# Patient Record
Sex: Male | Born: 1965 | Race: Black or African American | Hispanic: No | Marital: Married | Smoking: Never smoker
Health system: Southern US, Community
[De-identification: ages and names within clinical notes are randomized; demographics above are authoritative.]

## PROBLEM LIST (undated history)

## (undated) DIAGNOSIS — I10 Essential (primary) hypertension: Secondary | ICD-10-CM

## (undated) HISTORY — PX: ANKLE SURGERY: SHX546

---

## 2018-01-21 ENCOUNTER — Other Ambulatory Visit: Payer: Self-pay

## 2018-01-21 ENCOUNTER — Emergency Department (HOSPITAL_BASED_OUTPATIENT_CLINIC_OR_DEPARTMENT_OTHER)
Admission: EM | Admit: 2018-01-21 | Discharge: 2018-01-21 | Disposition: A | Discharge status: Home / Self Care | Payer: BC Managed Care – PPO | Attending: Emergency Medicine | Admitting: Emergency Medicine

## 2018-01-21 ENCOUNTER — Encounter (HOSPITAL_BASED_OUTPATIENT_CLINIC_OR_DEPARTMENT_OTHER): Payer: Self-pay | Admitting: Emergency Medicine

## 2018-01-21 ENCOUNTER — Emergency Department (HOSPITAL_BASED_OUTPATIENT_CLINIC_OR_DEPARTMENT_OTHER): Payer: BC Managed Care – PPO

## 2018-01-21 DIAGNOSIS — R072 Precordial pain: Secondary | ICD-10-CM | POA: Diagnosis not present

## 2018-01-21 DIAGNOSIS — I10 Essential (primary) hypertension: Secondary | ICD-10-CM | POA: Diagnosis not present

## 2018-01-21 DIAGNOSIS — Z79899 Other long term (current) drug therapy: Secondary | ICD-10-CM | POA: Diagnosis not present

## 2018-01-21 DIAGNOSIS — R079 Chest pain, unspecified: Secondary | ICD-10-CM | POA: Diagnosis present

## 2018-01-21 HISTORY — DX: Essential (primary) hypertension: I10

## 2018-01-21 LAB — CBC
HEMATOCRIT: 45.6 % (ref 39.0–52.0)
Hemoglobin: 15.3 g/dL (ref 13.0–17.0)
MCH: 28.2 pg (ref 26.0–34.0)
MCHC: 33.6 g/dL (ref 30.0–36.0)
MCV: 84.1 fL (ref 78.0–100.0)
Platelets: 238 10*3/uL (ref 150–400)
RBC: 5.42 MIL/uL (ref 4.22–5.81)
RDW: 12.9 % (ref 11.5–15.5)
WBC: 8.5 10*3/uL (ref 4.0–10.5)

## 2018-01-21 LAB — BASIC METABOLIC PANEL
BUN: 15 mg/dL (ref 6–20)
CREATININE: 1.25 mg/dL — AB (ref 0.61–1.24)
GFR calc non Af Amer: >60 mL/min (ref >60)

## 2018-01-21 LAB — I-STAT CHEM 8, ED
BUN: 14 mg/dL (ref 6–20)
CALCIUM ION: 1.23 mmol/L (ref 1.15–1.40)
CHLORIDE: 100 mmol/L — AB (ref 101–111)
Creatinine, Ser: 1.2 mg/dL (ref 0.61–1.24)
GLUCOSE: 122 mg/dL — AB (ref 65–99)
HCT: 48 % (ref 39.0–52.0)
HEMOGLOBIN: 16.3 g/dL (ref 13.0–17.0)
Potassium: 4.1 mmol/L (ref 3.5–5.1)
SODIUM: 140 mmol/L (ref 135–145)
TCO2: 27 mmol/L (ref 22–32)

## 2018-01-21 LAB — TROPONIN I

## 2018-01-21 MED ORDER — ALUM & MAG HYDROXIDE-SIMETH 200-200-20 MG/5ML PO SUSP
30.0000 mL | Freq: Once | ORAL | Status: AC
  Administered 2018-01-21: 30 mL via ORAL
  Filled 2018-01-21: qty 30

## 2018-01-21 NOTE — ED Provider Notes (Signed)
[facility] EMERGENCY DEPARTMENT Provider Note   CSN: [id number] Arrival date & time: 01/21/18  1008     History   Chief Complaint Chief Complaint  Patient presents with  . Chest Pain    HPI Brad Nguyen is a 52 y.o. male.  HPI 52 year old male presents the emergency department with sharp chest pain which began this morning.  His left side without significant radiation.  He came to the ER for further evaluation.  He had similar pain around Thanksgiving as well as similar pain around Christmas time.  He has seen several providers including a cardiologist in the [place] area.  He underwent a stress test approximately 2 weeks ago which she has not received the results of but he states he has not been contacted either.  He is scheduled to see the cardiologist next week.  He is on metoprolol.  He has no other complaints at this time.  No fevers or chills.  Denies productive cough.  No family history of early cardiac disease.  He states no chest discomfort during his stress test.   Past Medical History:  Diagnosis Date  . Hypertension     There are no active problems to display for this patient.   Past Surgical History:  Procedure Laterality Date  . ANKLE SURGERY         Home Medications    Prior to Admission medications   Medication Sig Start Date End Date Taking? Authorizing Provider  metoprolol succinate (TOPROL-XL) 25 MG 24 hr tablet Take 25 mg by mouth daily.   Yes [provider]    Family History No family history on file.  Social History Social History   Tobacco Use  . Smoking status: Never Smoker  . Smokeless tobacco: Never Used  Substance Use Topics  . Alcohol use: Yes    Frequency: Never    Comment: occ  . Drug use: No     Allergies   Patient has no known allergies.   Review of Systems Review of Systems  All other systems reviewed and are negative.    Physical Exam Updated Vital Signs BP (!) 142/88   Pulse 71   Temp  98.3 F (36.8 C) (Oral)   Resp 17   Ht 5\' 11"  (1.803 m)   Wt 124.7 kg (275 lb)   SpO2 99%   BMI 38.35 kg/m   Physical Exam  Constitutional: He is oriented to person, place, and time.  HENT:  Head: Normocephalic and atraumatic.  Eyes: EOM are normal.  Neck: Normal range of motion.  Cardiovascular: Normal rate, regular rhythm and intact distal pulses.  Pulmonary/Chest: Effort normal and breath sounds normal. No respiratory distress.  Abdominal: He exhibits no distension. There is no tenderness.  Musculoskeletal: Normal range of motion.  Neurological: He is alert and oriented to person, place, and time.  Skin: Skin is warm and dry.  Nursing note and vitals reviewed.    ED Treatments / Results  Labs (all labs ordered are listed, but only abnormal results are displayed) Labs Reviewed  BASIC METABOLIC PANEL - Abnormal; Notable for the following components:      Result Value   Creatinine, Ser 1.25 (*)    All other components within normal limits  I-STAT CHEM 8, ED - Abnormal; Notable for the following components:   Chloride 100 (*)    Glucose, Bld 122 (*)    All other components within normal limits  CBC  TROPONIN I    EKG  EKG  Interpretation  Date/Time:  Monday January 21 2018 10:19:44 EST Ventricular Rate:  74 PR Interval:    QRS Duration: 89 QT Interval:  373 QTC Calculation: 414 R Axis:   36 Text Interpretation:  Sinus rhythm Borderline repolarization abnormality Baseline wander in lead(s) V1 anormal ST segment lateral leads and inferior lead depression No old tracing to compare Confirmed by Eber Hong ([id number]) on 01/23/2018 10:36:13 AM       Radiology Dg Chest 2 View  Result Date: 01/21/2018 CLINICAL DATA:  Shortness of breath and chest pain since beginning a new cardiac medication 3 weeks ago. Patient is an occasional smoker. There is history of hypertension. EXAM: CHEST  2 VIEW COMPARISON:  None in PACs FINDINGS: The lungs are adequately inflated. There is  no focal infiltrate. There is no pleural effusion. The heart and pulmonary vascularity are normal. The mediastinum is normal in width. There calcification in the wall of the aortic arch. The trachea is midline. The bony thorax is unremarkable. IMPRESSION: There is no active cardiopulmonary disease. Thoracic aortic atherosclerosis. Electronically Signed   By: David  [place] M.D.   On: 01/21/2018 12:04    Procedures Procedures (including critical care time)  Medications Ordered in ED Medications  alum & mag hydroxide-simeth (MAALOX/MYLANTA) 200-200-20 MG/5ML suspension 30 mL (30 mLs Oral Given 01/21/18 1123)     Initial Impression / Assessment and Plan / ED Course  I have reviewed the triage vital signs and the nursing notes.  Pertinent labs & imaging results that were available during my care of the patient were reviewed by me and considered in my medical decision making (see chart for details).    Overall the patient is well-appearing.  His EKG is without ischemic changes.  He has had resolution of his chest discomfort.  Troponin negative.  Patient will need outpatient cardiology and primary care follow-up.  He has been seen at several emergency departments with chest pain.  He has had chest pain rule out in the hospital before and reassuring echocardiogram within the past 2 years.  Patient understands to return the emergency department for new or worsening symptoms.  Doubt dissection.  Doubt PE.  Doubt ACS.  Final Clinical Impressions(s) / ED Diagnoses   Final diagnoses:  Precordial chest pain    ED Discharge Orders    None       Azalia Bilis, MD 01/24/18 [phone]

## 2018-01-21 NOTE — ED Triage Notes (Signed)
Patient has had intermittent chest pain since this past thanksgiving. Has had multiple work up. Patient states that he has had 2d echos and is awaiting a cardiology appointment in [place].

## 2018-01-21 NOTE — Discharge Instructions (Signed)
Please follow up with your cardiologist as scheduled or sooner as needed

## 2018-10-25 ENCOUNTER — Ambulatory Visit
Admission: EM | Admit: 2018-10-25 | Discharge: 2018-10-25 | Disposition: A | Discharge status: Home / Self Care | Payer: BC Managed Care – PPO | Attending: Emergency Medicine | Admitting: Emergency Medicine

## 2018-10-25 ENCOUNTER — Ambulatory Visit (INDEPENDENT_AMBULATORY_CARE_PROVIDER_SITE_OTHER): Payer: BC Managed Care – PPO

## 2018-10-25 ENCOUNTER — Other Ambulatory Visit: Payer: Self-pay

## 2018-10-25 DIAGNOSIS — K219 Gastro-esophageal reflux disease without esophagitis: Secondary | ICD-10-CM | POA: Insufficient documentation

## 2018-10-25 DIAGNOSIS — I10 Essential (primary) hypertension: Secondary | ICD-10-CM | POA: Diagnosis not present

## 2018-10-25 DIAGNOSIS — R072 Precordial pain: Secondary | ICD-10-CM

## 2018-10-25 DIAGNOSIS — Z79899 Other long term (current) drug therapy: Secondary | ICD-10-CM | POA: Diagnosis not present

## 2018-10-25 DIAGNOSIS — Z8249 Family history of ischemic heart disease and other diseases of the circulatory system: Secondary | ICD-10-CM | POA: Diagnosis not present

## 2018-10-25 LAB — TROPONIN I

## 2018-10-25 MED ORDER — ALUM & MAG HYDROXIDE-SIMETH 200-200-20 MG/5ML PO SUSP: 30.0000 mL | Freq: Once | ORAL | Status: DC

## 2018-10-25 MED ORDER — PANTOPRAZOLE SODIUM 20 MG PO TBEC: 20.0000 mg | DELAYED_RELEASE_TABLET | Freq: Every day | ORAL | 0 refills | Status: AC

## 2018-10-25 MED ORDER — KETOROLAC TROMETHAMINE 60 MG/2ML IM SOLN
30.0000 mg | Freq: Once | INTRAMUSCULAR | Status: AC
  Administered 2018-10-25: 30 mg via INTRAMUSCULAR

## 2018-10-25 MED ORDER — LIDOCAINE VISCOUS HCL 2 % MT SOLN: 15.0000 mL | Freq: Once | OROMUCOSAL | Status: DC

## 2018-10-25 MED ORDER — NAPROXEN 500 MG PO TABS: 500.0000 mg | ORAL_TABLET | Freq: Two times a day (BID) | ORAL | 0 refills | Status: AC

## 2018-10-25 NOTE — Discharge Instructions (Addendum)
Troponin was negative.  Your EKG showed no signs of ischemia and you had a normal chest x-ray.  I suspect that this could be musculoskeletal, such as a costochondritis.  The Naprosyn will help with any inflammation.  Her GERD could also be contributing to this, so start the Protonix.  You may increase it to 2 tabs once a day. Here is a list of primary care providers who are taking new patients:  Dr. Elizabeth Sauer, Dr. Schuyler Amor [address] [place] [address] [phone]  [facility] [address] [place] [address]  [phone]  [facility] [address] [place], [place] [address] [phone]  [facility] [address] [place]  [phone] [place], [place] [address]  Here are clinics/ other resources who will see you if you do not have insurance. Some have certain criteria that you must meet. Call them and find out what they are:  [facility]: [address] [place], [place] [address] Phone: [phone] Hours: First and Third Saturdays of each Month, 9 a.m. - 1 p.m.  [facility]: [address] [place], [place] [address] Phone: [phone] Hours: Tuesday, 4 p.m. - 8 p.m. Thursday, 1 p.m. - 8 p.m. Wednesday, 9 a.m. - [facility] [address] [place], [place] [address] Phone: [phone] Pharmacy Phone Number: [phone] Dental Phone Number: [phone] [facility] Insurance Help: [phone]  Dental Hours: Monday - Thursday, 8 a.m. - 6 p.m.  [facility] [address] [place], [place] [address] Phone: [phone] Pharmacy Phone Number: [phone] [facility] Insurance Help: [phone]  [facility] [address] [place], [place] [address] Phone: [phone] Pharmacy Phone Number: [phone] [facility] Insurance Help: [phone]  [facility] [address] [place], [place] [address] Phone:  [phone] [facility] Insurance Help: [phone]   [facility] [address], [place], [place] [address] Phone: [phone]  Go to www.goodrx.com to look up your medications. This will give you a list of where you can find your prescriptions at the most affordable prices. Or ask the pharmacist what the cash price is, or if they have any other discount programs available to help make your medication more affordable. This can be less expensive than what you would pay with insurance.

## 2018-10-25 NOTE — ED Triage Notes (Signed)
Per Dr. Chaney Malling pt given GI cocktail.

## 2018-10-25 NOTE — ED Provider Notes (Signed)
HPI  SUBJECTIVE:  Brad Nguyen is a 52 y.o. male who presents with 1 week of intermittent left-sided chest pain described as sharp, pressure with radiation down his left medial inner arm.  States that it lasts for about 30 minutes and then resolves.  Is not sure when this current episode started exactly.  It is worse with lying on his side, sitting up straight, better with raising his arms above his head, shoulder movement, stretching.  Reports shortness of breath with it.  He has had identical pain over the past year, and states that it is the worst that it has ever been today.    He denies nausea, vomiting, diaphoresis, coughing, wheezing, waterbrash, belching.  States that he had some abdominal pain located on both of his sides described as squeezing, but this has since resolved.  It is not necessarily associated with the chest pain.  No recent URI, viral syndrome, trauma to the chest.  No change in his physical activity.  No exertional component.  It is worse with sitting straight up.  He denies pleuritic pain, lower extremity edema, calf pain, prolonged immobilization, surgery in the past 4 weeks, hemoptysis.  He has had 3 negative ER work-ups for this identical pain within the past year.  He also been evaluated by cardiology for this identical pain.  Care everywhere records show that he had a normal stress echo in January 2019 and a cardiac catheterization in February 2019 that was negative for coronary artery disease.  He has a past medical history of GERD, he stopped taking his Dexilant 6 months ago,  Has tried Zantac.  Is requesting to be started on another PPI.  No history of hypertension.  He is on metoprolol to help his EF.  No history of coronary disease, MI, hypercholesterolemia, HIV, diabetes, smoking, vaping, PE, DVT, cancer.  Family history significant for mother with an MI at age 14.  PMD: None.  Cardiology: Dr. Fayne Norrie in [place].    Past Medical History:  Diagnosis Date  . Hypertension      Past Surgical History:  Procedure Laterality Date  . ANKLE SURGERY      Family History  Problem Relation Age of Onset  . Blindness Mother   . CVA Mother   . Heart attack Mother     Social History   Tobacco Use  . Smoking status: Never Smoker  . Smokeless tobacco: Never Used  Substance Use Topics  . Alcohol use: Yes    Frequency: Never    Comment: occ  . Drug use: No    No current facility-administered medications for this encounter.   Current Outpatient Medications:  .  metoprolol succinate (TOPROL-XL) 25 MG 24 hr tablet, Take 25 mg by mouth daily., Disp: , Rfl:  .  naproxen (NAPROSYN) 500 MG tablet, Take 1 tablet (500 mg total) by mouth 2 (two) times daily., Disp: 20 tablet, Rfl: 0 .  pantoprazole (PROTONIX) 20 MG tablet, Take 1 tablet (20 mg total) by mouth daily. May increase to 2 tabs once daily., Disp: 30 tablet, Rfl: 0  No Known Allergies   ROS  As noted in HPI.   Physical Exam  BP (!) 151/92 (BP Location: Left Arm)   Pulse 77   Temp 98.7 F (37.1 C) (Oral)   Resp 18   Ht 5\' 11"  (1.803 m)   Wt 117.9 kg   SpO2 98%   BMI 36.26 kg/m   Constitutional: Well developed, well nourished, no acute distress Eyes:  EOMI,  conjunctiva normal bilaterally HENT: Normocephalic, atraumatic,mucus membranes moist Respiratory: Normal inspiratory effort, lungs clear bilaterally.  Positive reproducible chest wall tenderness over the lower left costochondral junctions.  No pleuritic pain. Cardiovascular: Normal rate rhythm, no murmurs, rubs, gallops. GI: nondistended soft.,  No guarding, rebound.  Active bowel sounds. skin: No rash, skin intact Musculoskeletal: no deformities Neurologic: Alert & oriented x 3, no focal neuro deficits Psychiatric: Speech and behavior appropriate   ED Course   Medications  ketorolac (TORADOL) injection 30 mg (30 mg Intramuscular Given 10/25/18 1124)    Orders Placed This Encounter  Procedures  . DG Chest 2 View    Standing  Status:   Standing    Number of Occurrences:   1    Order Specific Question:   Reason for Exam (SYMPTOM  OR DIAGNOSIS REQUIRED)    Answer:   chest pain, left sided  . Troponin I    Standing Status:   Standing    Number of Occurrences:   1  . ED EKG    Standing Status:   Standing    Number of Occurrences:   1    Order Specific Question:   Reason for Exam    Answer:   Chest Pain  . EKG 12-Lead    Standing Status:   Standing    Number of Occurrences:   1    Results for orders placed or performed during the hospital encounter of 10/25/18 (from the past 24 hour(s))  Troponin I     Status: None   Collection Time: 10/25/18 10:54 AM  Result Value Ref Range   Troponin I <0.03 <0.03 ng/mL   Dg Chest 2 View  Result Date: 10/25/2018 CLINICAL DATA:  Left chest pain for 1 year EXAM: CHEST - 2 VIEW COMPARISON:  January 22, 2028 FINDINGS: The heart size and mediastinal contours are within normal limits. Both lungs are clear. The visualized skeletal structures are stable. IMPRESSION: No active cardiopulmonary disease. Electronically Signed   By: Sherian Rein M.D.   On: 10/25/2018 11:16    ED Clinical Impression  Precordial pain   ED Assessment/Plan   Care everywhere records reviewed.  EKG: Normal sinus rhythm, rate 73.  Normal axis, normal intervals.  No hypertrophy.  No ST T wave elevation.  Inverted T waves in 3, present on previous EKG from 01/2018.  This appears to be very musculoskeletal, he has tenderness over the lower costochondral junctions that reproduces his chest pain.  His EKG has no signs of ischemia.  He was symptomatic while the EKG was obtained.  will check a chest x-ray and troponin. will give GI cocktail as he states that his GERD is bothering him and also 30 mg of Toradol IM.  Will reevaluate.  Reviewed imaging independently.  Normal chest x-ray.  see radiology report for full details.  Doubt dissection.  No evidence of pneumonia or pneumothorax on chest x-ray.   Troponin negative.  Normal vitals.  Doubt ACS, PE.  On reevaluation, patient states that he feels better.  Will send home with NSAIDs, start him on some Protonix, give him a primary care referral list.  Gave him very strict ER return precautions.  Discussed labs, imaging, MDM, treatment plan, and plan for follow-up with patient. Discussed sn/sx that should prompt return to the ED. patient agrees with plan.   Meds ordered this encounter  Medications  . DISCONTD: alum & mag hydroxide-simeth (MAALOX/MYLANTA) 200-200-20 MG/5ML suspension 30 mL  . DISCONTD: lidocaine (XYLOCAINE) 2 % viscous mouth  solution 15 mL  . ketorolac (TORADOL) injection 30 mg  . pantoprazole (PROTONIX) 20 MG tablet    Sig: Take 1 tablet (20 mg total) by mouth daily. May increase to 2 tabs once daily.    Dispense:  30 tablet    Refill:  0  . naproxen (NAPROSYN) 500 MG tablet    Sig: Take 1 tablet (500 mg total) by mouth 2 (two) times daily.    Dispense:  20 tablet    Refill:  0    *This clinic note was created using Scientist, clinical (histocompatibility and immunogenetics). Therefore, there may be occasional mistakes despite careful proofreading.   ?   Domenick Gong, MD 10/25/18 1159

## 2018-10-25 NOTE — ED Triage Notes (Signed)
Patient complains of chest pain that started around 1 week ago and he thought this was gerd but chest pain worsened today. Reports that pain is in center of chest/left sided.

## 2019-01-26 IMAGING — CR DG CHEST 2V
2 series · 2 of 2 positions shown · non-contrast
Comparison: None in PACs

CLINICAL DATA: Shortness of breath and chest pain since beginning a
new cardiac medication 3 weeks ago. Patient is an occasional smoker.
There is history of hypertension.

EXAM:
CHEST  2 VIEW

[w chest pa]
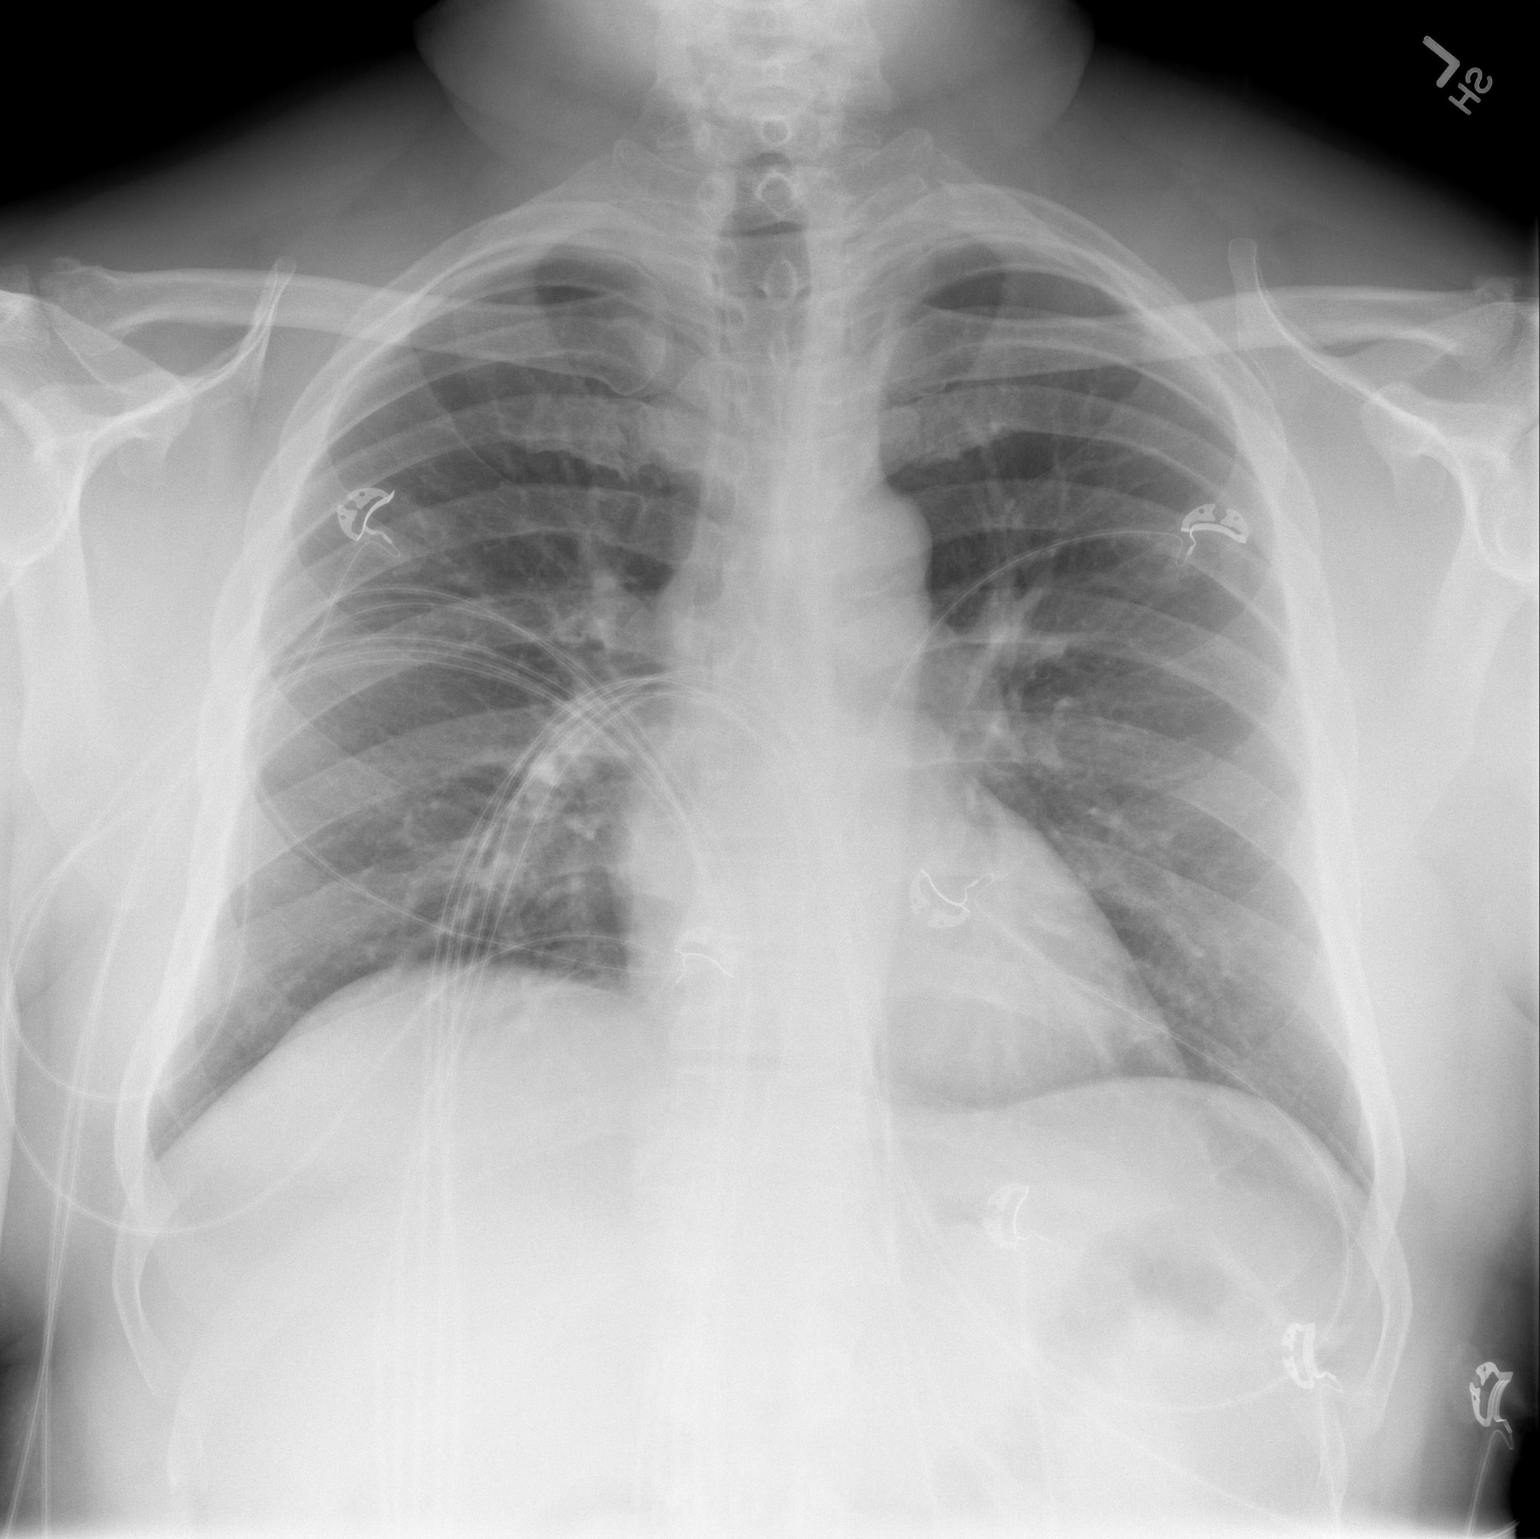

[w chest lat]
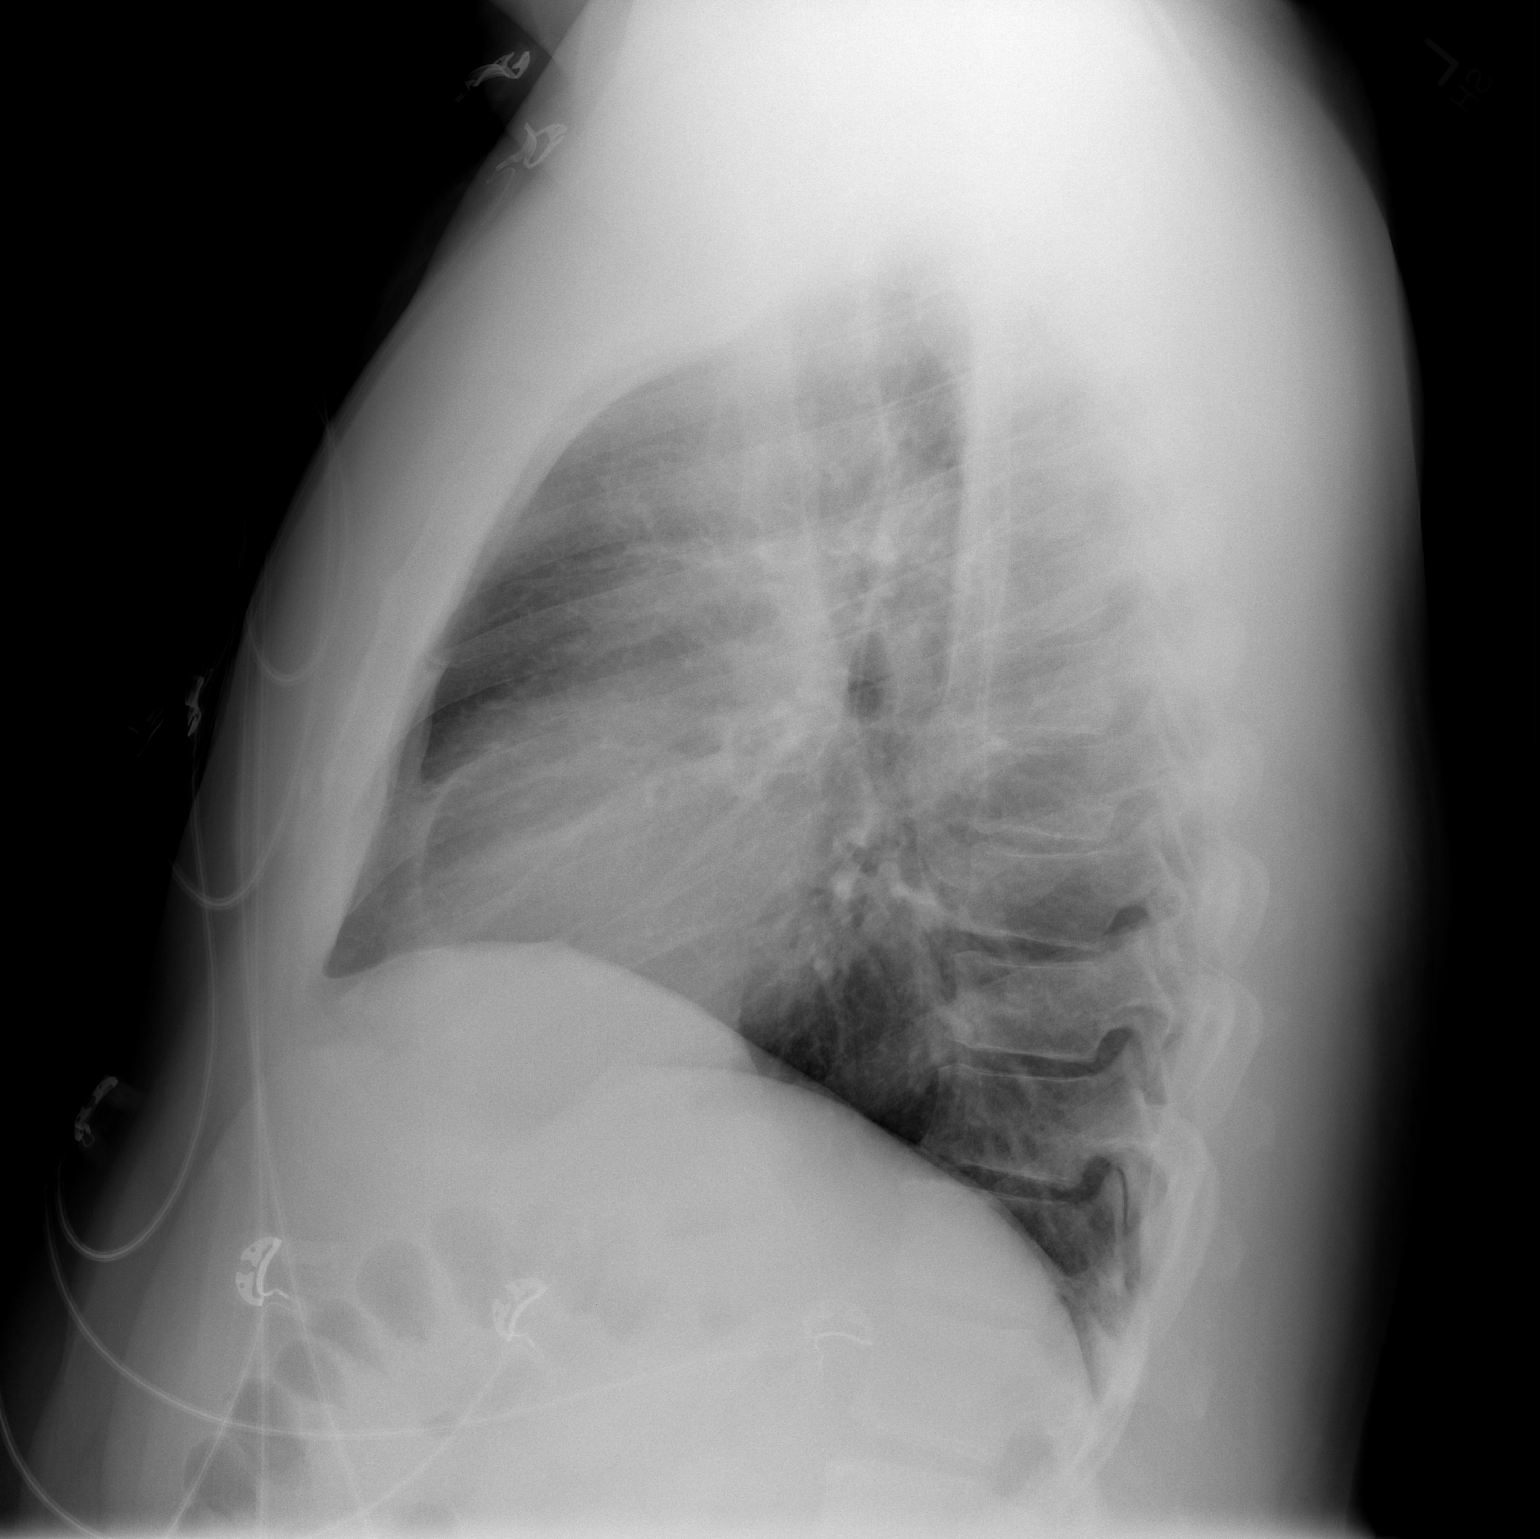

[2 of 2 positions shown; findings below may reference images not displayed]

FINDINGS: The lungs are adequately inflated. There is no focal infiltrate.
There is no pleural effusion. The heart and pulmonary vascularity
are normal. The mediastinum is normal in width. There calcification
in the wall of the aortic arch. The trachea is midline. The bony
thorax is unremarkable.
IMPRESSION: There is no active cardiopulmonary disease.

Thoracic aortic atherosclerosis.

## 2019-10-30 IMAGING — CR DG CHEST 2V
2 series · 2 of 2 positions shown · non-contrast
Comparison: January 22, 2028

CLINICAL DATA: Left chest pain for 1 year

EXAM:
CHEST - 2 VIEW

[chest pa]
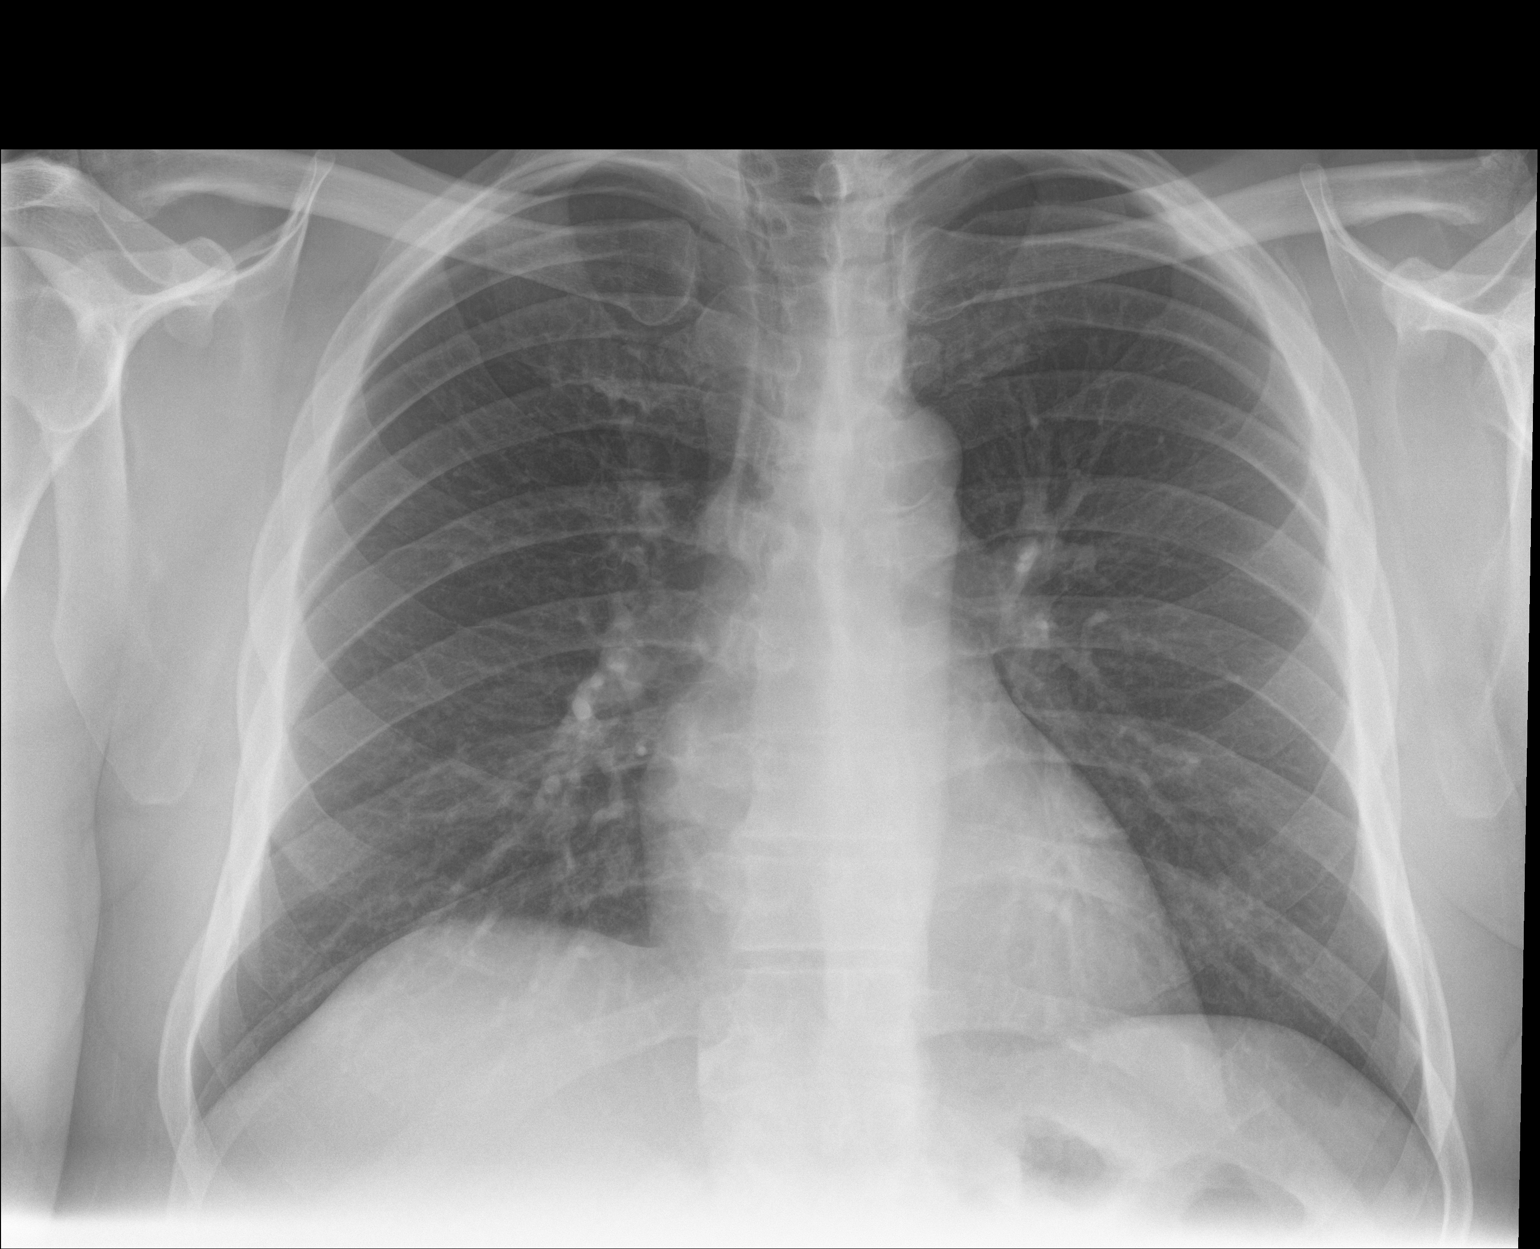

[chest lat]
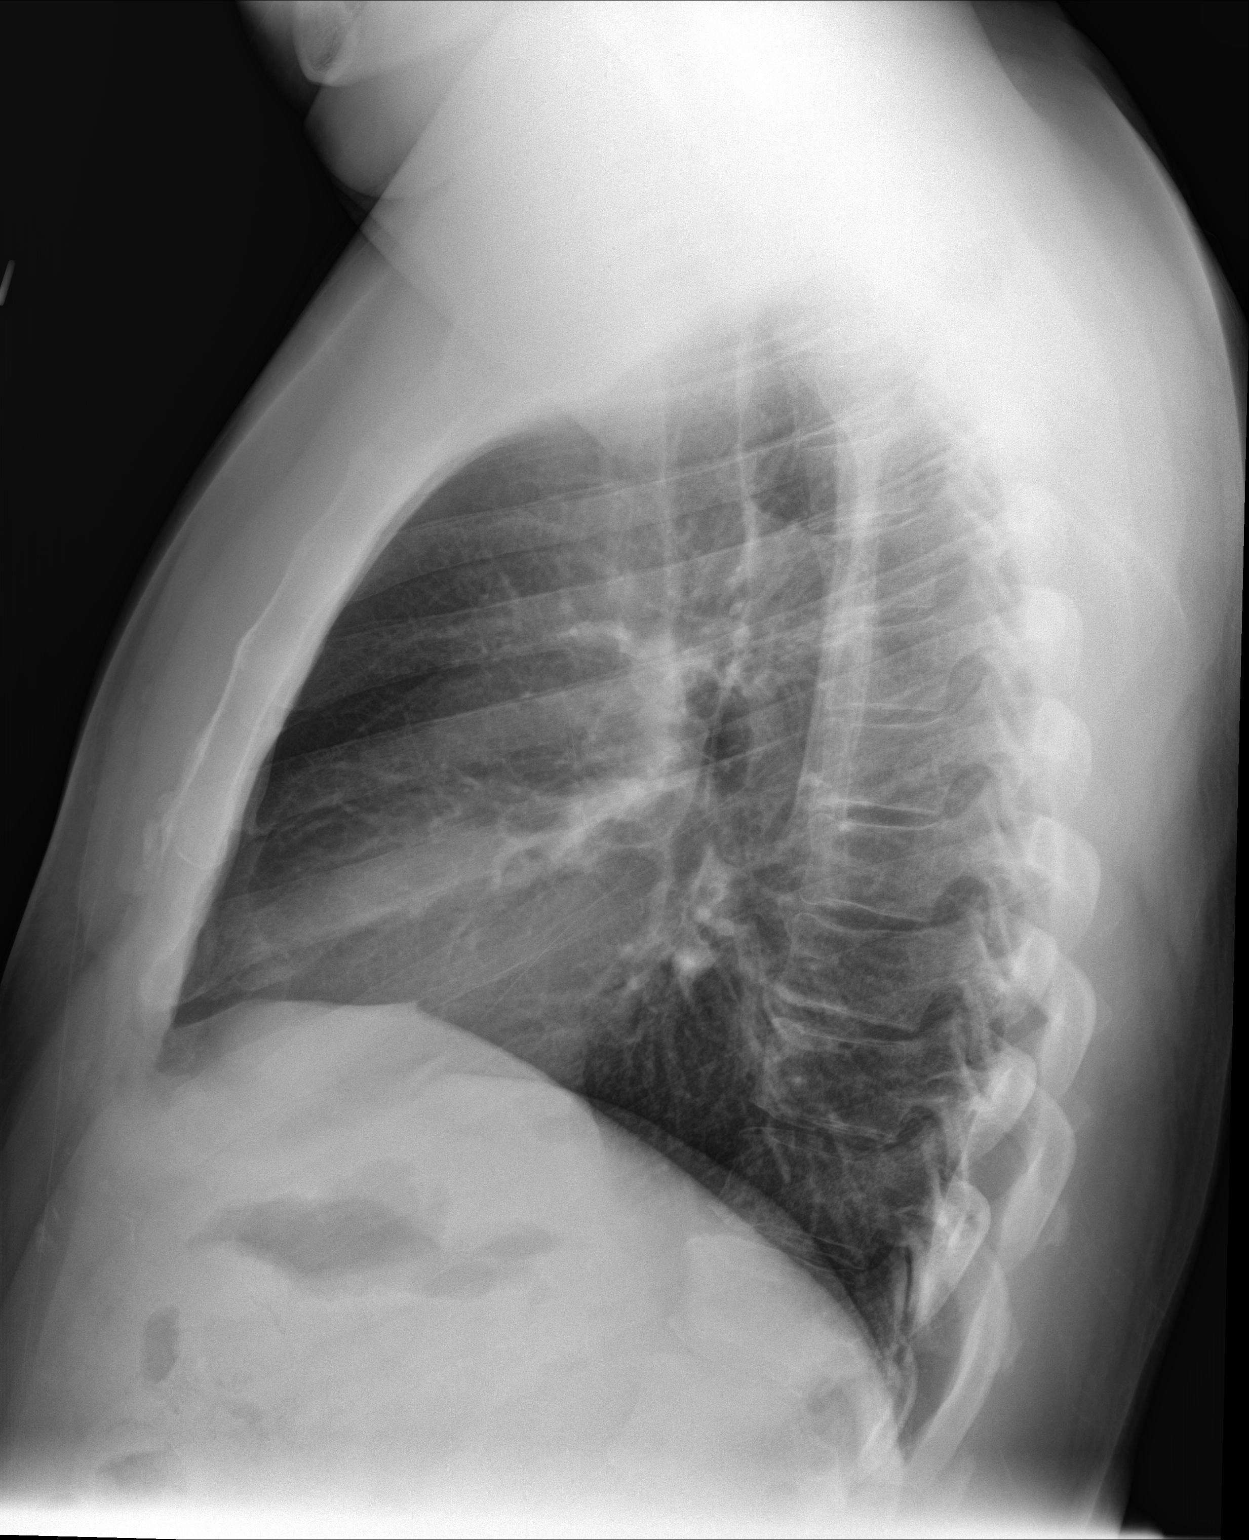

[2 of 2 positions shown; findings below may reference images not displayed]

FINDINGS: The heart size and mediastinal contours are within normal limits.
Both lungs are clear. The visualized skeletal structures are stable.
IMPRESSION: No active cardiopulmonary disease.

## 2021-12-16 ENCOUNTER — Other Ambulatory Visit: Payer: Self-pay

## 2021-12-16 ENCOUNTER — Encounter: Payer: Self-pay | Admitting: Emergency Medicine

## 2021-12-16 ENCOUNTER — Emergency Department
Admission: EM | Admit: 2021-12-16 | Discharge: 2021-12-16 | Disposition: A | Discharge status: Home / Self Care | Payer: BC Managed Care – PPO | Attending: Emergency Medicine | Admitting: Emergency Medicine

## 2021-12-16 ENCOUNTER — Emergency Department: Payer: BC Managed Care – PPO

## 2021-12-16 DIAGNOSIS — F17290 Nicotine dependence, other tobacco product, uncomplicated: Secondary | ICD-10-CM | POA: Insufficient documentation

## 2021-12-16 DIAGNOSIS — R0789 Other chest pain: Secondary | ICD-10-CM | POA: Diagnosis present

## 2021-12-16 DIAGNOSIS — Z79899 Other long term (current) drug therapy: Secondary | ICD-10-CM | POA: Diagnosis not present

## 2021-12-16 DIAGNOSIS — Z20822 Contact with and (suspected) exposure to covid-19: Secondary | ICD-10-CM | POA: Diagnosis not present

## 2021-12-16 DIAGNOSIS — R059 Cough, unspecified: Secondary | ICD-10-CM | POA: Insufficient documentation

## 2021-12-16 DIAGNOSIS — G4733 Obstructive sleep apnea (adult) (pediatric): Secondary | ICD-10-CM | POA: Diagnosis not present

## 2021-12-16 DIAGNOSIS — I10 Essential (primary) hypertension: Secondary | ICD-10-CM | POA: Diagnosis not present

## 2021-12-16 DIAGNOSIS — Z716 Tobacco abuse counseling: Secondary | ICD-10-CM | POA: Insufficient documentation

## 2021-12-16 DIAGNOSIS — R079 Chest pain, unspecified: Secondary | ICD-10-CM

## 2021-12-16 DIAGNOSIS — Z720 Tobacco use: Secondary | ICD-10-CM

## 2021-12-16 LAB — CBC WITH DIFFERENTIAL/PLATELET
Abs Immature Granulocytes: 0.05 10*3/uL (ref 0.00–0.07)
Basophils Absolute: 0 10*3/uL (ref 0.0–0.1)
Basophils Relative: 0 %
Eosinophils Absolute: 0.2 10*3/uL (ref 0.0–0.5)
Eosinophils Relative: 2 %
HCT: 47.8 % (ref 39.0–52.0)
Hemoglobin: 15.4 g/dL (ref 13.0–17.0)
Immature Granulocytes: 1 %
Lymphocytes Relative: 45 %
Lymphs Abs: 4.5 10*3/uL — ABNORMAL HIGH (ref 0.7–4.0)
MCH: 27.9 pg (ref 26.0–34.0)
MCHC: 32.2 g/dL (ref 30.0–36.0)
MCV: 86.8 fL (ref 80.0–100.0)
Monocytes Absolute: 0.9 10*3/uL (ref 0.1–1.0)
Monocytes Relative: 9 %
Neutro Abs: 4.2 10*3/uL (ref 1.7–7.7)
Neutrophils Relative %: 43 %
Platelets: 231 10*3/uL (ref 150–400)
RBC: 5.51 MIL/uL (ref 4.22–5.81)
RDW: 12.5 % (ref 11.5–15.5)
WBC: 9.8 10*3/uL (ref 4.0–10.5)
nRBC: 0 % (ref 0.0–0.2)

## 2021-12-16 LAB — COMPREHENSIVE METABOLIC PANEL
ALT: 43 U/L (ref 0–44)
AST: 31 U/L (ref 15–41)
Albumin: 4 g/dL (ref 3.5–5.0)
Alkaline Phosphatase: 75 U/L (ref 38–126)
Anion gap: 6 (ref 5–15)
BUN: 13 mg/dL (ref 6–20)
CO2: 26 mmol/L (ref 22–32)
Calcium: 8.8 mg/dL — ABNORMAL LOW (ref 8.9–10.3)
Chloride: 105 mmol/L (ref 98–111)
Creatinine, Ser: 1.17 mg/dL (ref 0.61–1.24)
GFR, Estimated: >60 mL/min (ref >60)
Glucose, Bld: 140 mg/dL — ABNORMAL HIGH (ref 70–99)
Potassium: 3.5 mmol/L (ref 3.5–5.1)
Sodium: 137 mmol/L (ref 135–145)
Total Bilirubin: 0.8 mg/dL (ref 0.3–1.2)
Total Protein: 7.3 g/dL (ref 6.5–8.1)

## 2021-12-16 LAB — RESP PANEL BY RT-PCR (FLU A&B, COVID) ARPGX2
Influenza A by PCR: NEGATIVE
Influenza B by PCR: NEGATIVE
SARS Coronavirus 2 by RT PCR: NEGATIVE

## 2021-12-16 LAB — TROPONIN I (HIGH SENSITIVITY): Troponin I (High Sensitivity): 7 ng/L (ref <18)

## 2021-12-16 MED ORDER — ACETAMINOPHEN 500 MG PO TABS
1000.0000 mg | ORAL_TABLET | Freq: Once | ORAL | Status: AC
  Administered 2021-12-16: 09:00:00 1000 mg via ORAL
  Filled 2021-12-16: qty 2

## 2021-12-16 MED ORDER — IBUPROFEN 400 MG PO TABS
400.0000 mg | ORAL_TABLET | Freq: Once | ORAL | Status: AC
  Administered 2021-12-16: 09:00:00 400 mg via ORAL
  Filled 2021-12-16: qty 1

## 2021-12-16 NOTE — ED Notes (Signed)
See triage note  presents with chest pain since last pm  denies any fever or n/v   resp even and non labored  no cough

## 2021-12-16 NOTE — ED Provider Notes (Addendum)
[facility] Emergency Department Provider Note  ____________________________________________   Event Date/Time   First MD Initiated Contact with Patient 12/16/21 0813     (approximate)  I have reviewed the triage vital signs and the nursing notes.   HISTORY  Chief Complaint Chest Pain   HPI Brad Nguyen is a 55 y.o. male with past medical history of high blood pressure, OSA although notes CPAP machine is currently broken, and occasional cigar use who presents for assessment of some chest discomfort that started last night around 9 or 10 PM.  Patient states this in the setting of several months of some soreness in his back from a remote injury and some chronic sternal discomfort that he has not removed his arm is ongoing for several months.  He states he has felt short of breath after initially falling asleep and waking up that the shortness of breath has improved.  He had a slight cough this morning as well.  No fevers, no current shortness of breath and his chest pain is now much better.  He denies any headache, earache, sore throat, vomiting, diarrhea, burning with urination, rash or any other acute complaints at this time.  No known cardiac disease.  No history of DVT/PE.  No recent EtOH or illicit drug use.         Past Medical History:  Diagnosis Date   Hypertension     There are no problems to display for this patient.   Past Surgical History:  Procedure Laterality Date   ANKLE SURGERY      Prior to Admission medications   Medication Sig Start Date End Date Taking? Authorizing Provider  metoprolol succinate (TOPROL-XL) 25 MG 24 hr tablet Take 25 mg by mouth daily.    [provider]  naproxen (NAPROSYN) 500 MG tablet Take 1 tablet (500 mg total) by mouth 2 (two) times daily. 10/25/18   Domenick Gong, MD  pantoprazole (PROTONIX) 20 MG tablet Take 1 tablet (20 mg total) by mouth daily. May increase to 2 tabs once daily. 10/25/18    Domenick Gong, MD    Allergies Patient has no known allergies.  Family History  Problem Relation Age of Onset   Blindness Mother    CVA Mother    Heart attack Mother     Social History Social History   Tobacco Use   Smoking status: Never   Smokeless tobacco: Never  Vaping Use   Vaping Use: Never used  Substance Use Topics   Alcohol use: Yes    Comment: occ   Drug use: No    Review of Systems  Review of Systems  Constitutional:  Negative for chills and fever.  HENT:  Negative for sore throat.   Eyes:  Negative for pain.  Respiratory:  Positive for shortness of breath. Negative for cough and stridor.   Cardiovascular:  Positive for chest pain (acute on chronic).  Gastrointestinal:  Negative for vomiting.  Genitourinary:  Negative for dysuria.  Musculoskeletal:  Positive for back pain (chronic). Negative for myalgias.  Skin:  Negative for rash.  Neurological:  Negative for seizures, loss of consciousness and headaches.  Psychiatric/Behavioral:  Negative for suicidal ideas.   All other systems reviewed and are negative.    ____________________________________________   PHYSICAL EXAM:  VITAL SIGNS: ED Triage Vitals  Enc Vitals Group     BP 12/16/21 0530 (!) 152/92     Pulse Rate 12/16/21 0530 91     Resp 12/16/21 0530 18  Temp 12/16/21 0530 98.1 F (36.7 C)     Temp Source 12/16/21 0530 Oral     SpO2 12/16/21 0530 98 %     Weight 12/16/21 0527 280 lb (127 kg)     Height 12/16/21 0527 5\' 11"  (1.803 m)     Head Circumference --      Peak Flow --      Pain Score 12/16/21 0527 7     Pain Loc --      Pain Edu? --      Excl. in GC? --    Vitals:   12/16/21 0530 12/16/21 0751  BP: (!) 152/92 (!) 143/94  Pulse: 91 78  Resp: 18 18  Temp: 98.1 F (36.7 C)   SpO2: 98% 96%   Physical Exam Vitals and nursing note reviewed.  Constitutional:      General: He is not in acute distress.    Appearance: He is well-developed.  HENT:     Head:  Normocephalic and atraumatic.     Right Ear: External ear normal.     Left Ear: External ear normal.     Nose: Nose normal.  Eyes:     Conjunctiva/sclera: Conjunctivae normal.  Cardiovascular:     Rate and Rhythm: Normal rate and regular rhythm.     Heart sounds: No murmur heard. Pulmonary:     Effort: Pulmonary effort is normal. No respiratory distress.     Breath sounds: Normal breath sounds.  Abdominal:     Palpations: Abdomen is soft.     Tenderness: There is no abdominal tenderness.  Musculoskeletal:        General: No swelling.     Cervical back: Neck supple.  Skin:    General: Skin is warm and dry.     Capillary Refill: Capillary refill takes less than 2 seconds.  Neurological:     Mental Status: He is alert and oriented to person, place, and time.  Psychiatric:        Mood and Affect: Mood normal.    Chest pain is reproducible patient abducting and forward flexing extending his bilateral arms.  Chest and back is otherwise unremarkable. ____________________________________________   LABS (all labs ordered are listed, but only abnormal results are displayed)  Labs Reviewed  CBC WITH DIFFERENTIAL/PLATELET - Abnormal; Notable for the following components:      Result Value   Lymphs Abs 4.5 (*)    All other components within normal limits  COMPREHENSIVE METABOLIC PANEL - Abnormal; Notable for the following components:   Glucose, Bld 140 (*)    Calcium 8.8 (*)    All other components within normal limits  RESP PANEL BY RT-PCR (FLU A&B, COVID) ARPGX2  TROPONIN I (HIGH SENSITIVITY)   ____________________________________________  EKG  ECG remarkable sinus rhythm with a ventricular rate of 93, normal axis, unremarkable intervals without evidence of acute ischemia or significant arrhythmia. ____________________________________________  RADIOLOGY  ED MD interpretation: Chest x-ray shows no focal consolidation, effusion, edema, pneumothorax or other clear acute  thoracic process.  Official radiology report(s): DG Chest 2 View  Result Date: 12/16/2021 CLINICAL DATA:  Chest pain EXAM: CHEST - 2 VIEW COMPARISON:  10/25/2018 FINDINGS: Normal heart size and mediastinal contours. No acute infiltrate or edema. No effusion or pneumothorax. No acute osseous findings. IMPRESSION: No active cardiopulmonary disease. Electronically Signed   By: 13/07/2018 M.D.   On: 12/16/2021 06:07    ____________________________________________   PROCEDURES  Procedure(s) performed (including Critical Care):  Procedures   ____________________________________________  INITIAL IMPRESSION / ASSESSMENT AND PLAN / ED COURSE      Patient presents with above to history exam for assessment of some left-sided chest pressure that seem different than usual chronic discomfort around his sternum and back he has had for several months after an injury.  He also states he had some shortness of breath when he woke up early in the morning after initially falling asleep and then this morning he also developed a mild nonproductive cough.  On arrival he is hypertensive with otherwise stable vital signs on room air.  Differential considerations include ACS, symptomatic OSA, anemia, pneumonia, PE, pneumothorax, dissection, GI etiologies of chest wall inflammation.  ECG remarkable sinus rhythm with a ventricular rate of 93, normal axis, unremarkable intervals without evidence of acute ischemia or significant arrhythmia.  Given nonelevated troponin obtained greater than 3 hours after symptom onset I have low suspicion for ACS at this time.  CMP shows no significant electrolyte or metabolic derangements.  CBC shows no leukocytosis or acute anemia.  Chest x-ray shows no focal consolidation, effusion, edema, pneumothorax or other clear acute thoracic process.  Overall I have low suspicion for PE as patient has a Wells score of 0.  Low suspicion for dissection at this time.  I suspect  possible acute viral infection versus acute on chronic chest wall inflammation possible esophageal spasm.  Low suspicion for bacterial pneumonia.  Will treat symptomatically with Tylenol and ibuprofen.  Emphasized importance of taking his blood pressure medicines getting his CPAP machine and use again.  Also discussed importance of tobacco cessation and importance of close outpatient PCP follow-up.  COVID and influenza PCR sent and advised patient he can follow this result up online.      ____________________________________________   FINAL CLINICAL IMPRESSION(S) / ED DIAGNOSES  Final diagnoses:  Chest pain, unspecified type  Hypertension, unspecified type  OSA (obstructive sleep apnea)  Tobacco abuse    Medications  acetaminophen (TYLENOL) tablet 1,000 mg (has no administration in time range)  ibuprofen (ADVIL) tablet 400 mg (has no administration in time range)     ED Discharge Orders     None        Note:  This document was prepared using Dragon voice recognition software and may include unintentional dictation errors.    Gilles Chiquito, MD 12/16/21 2297    Gilles Chiquito, MD 12/16/21 [phone]

## 2021-12-16 NOTE — ED Triage Notes (Signed)
Patient ambulatory to triage with steady gait, without difficulty or distress noted; pt reports left sided CP, nonradiating accomp by [facility] since last night

## 2022-12-21 IMAGING — CR DG CHEST 2V
1 series · 2 of 2 positions shown · non-contrast
Comparison: 10/25/2018

CLINICAL DATA: Chest pain

EXAM:
CHEST - 2 VIEW

[Series 1: dg chest 2 view · 0.14mm/px · 2 of 2 slices shown]
[im 1/2]
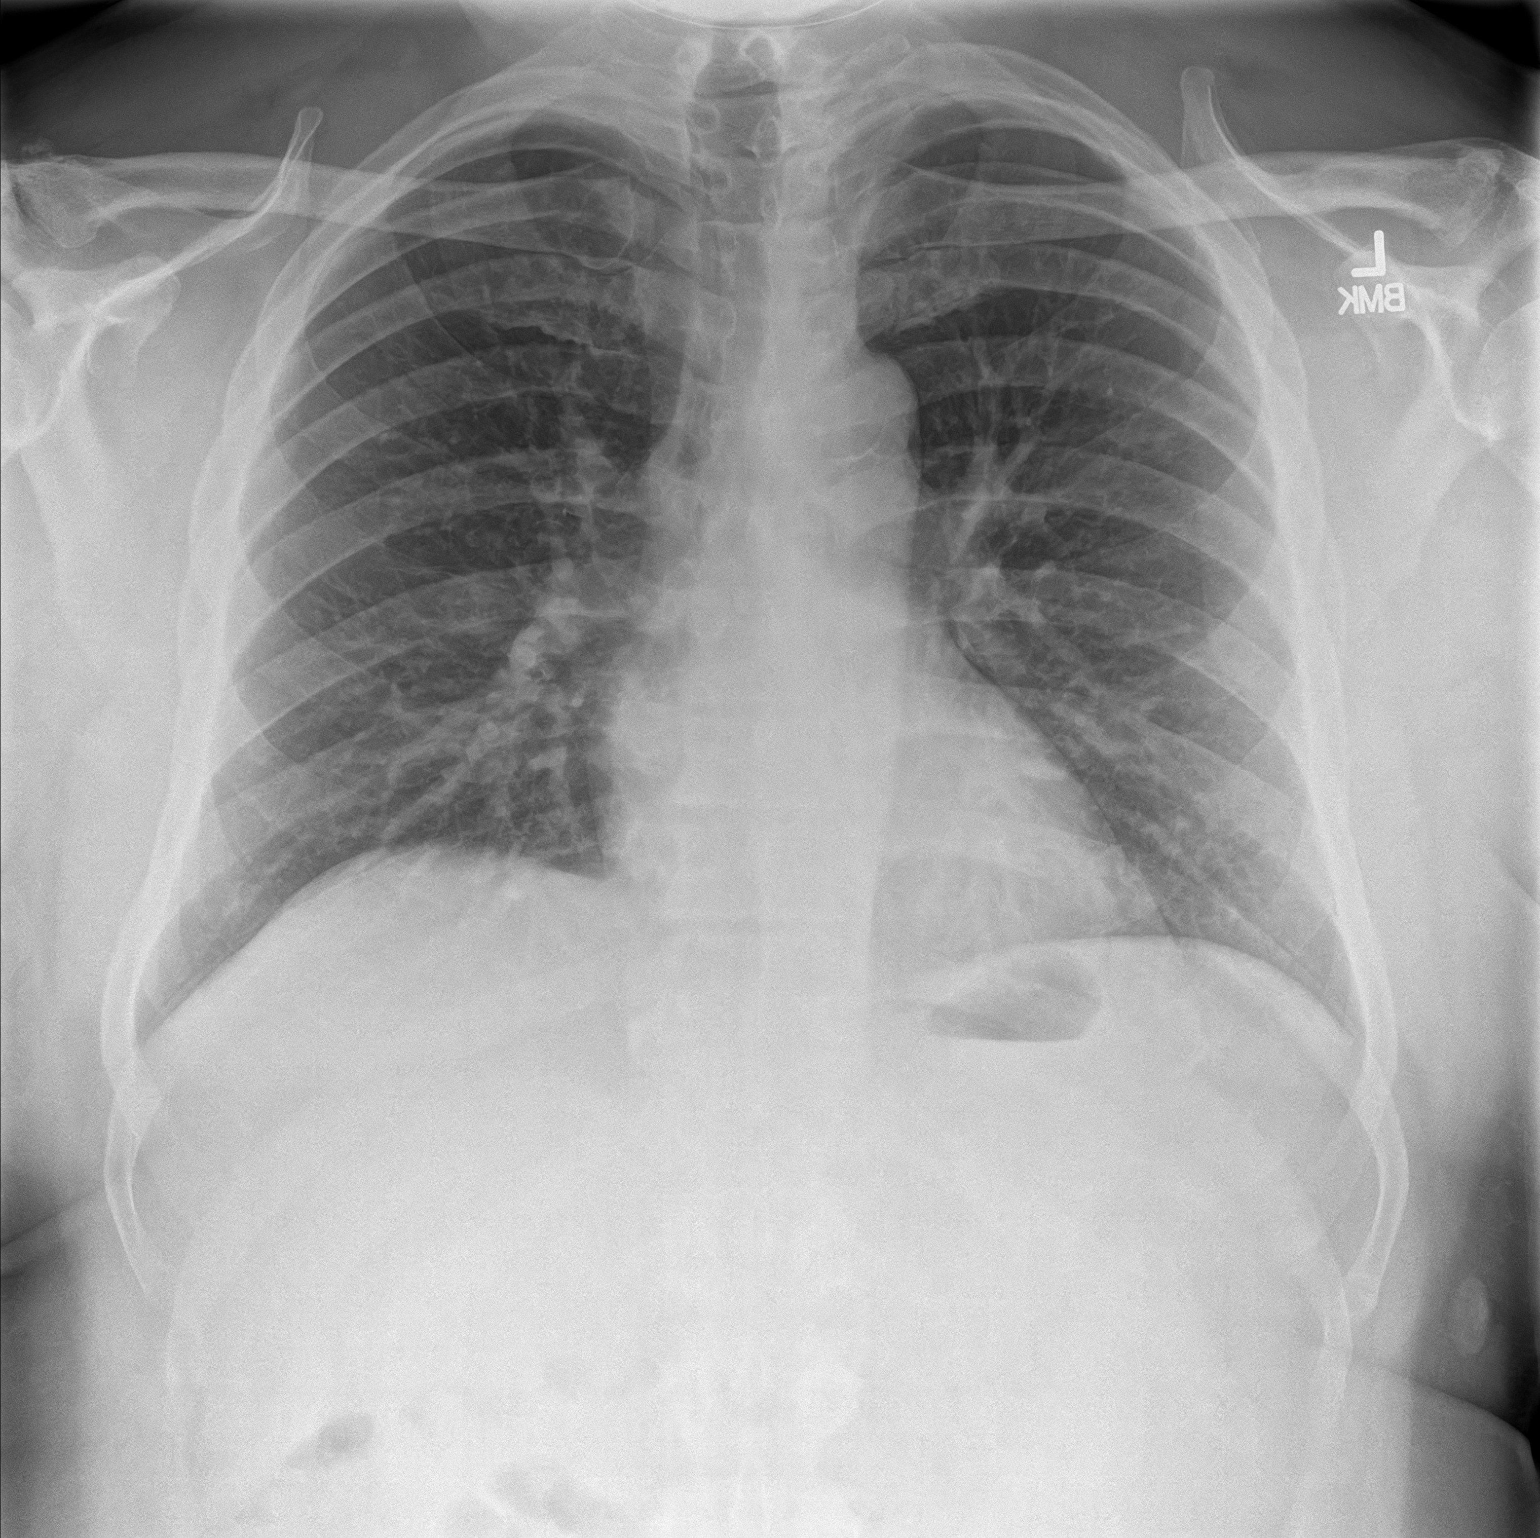
[im 2/2]
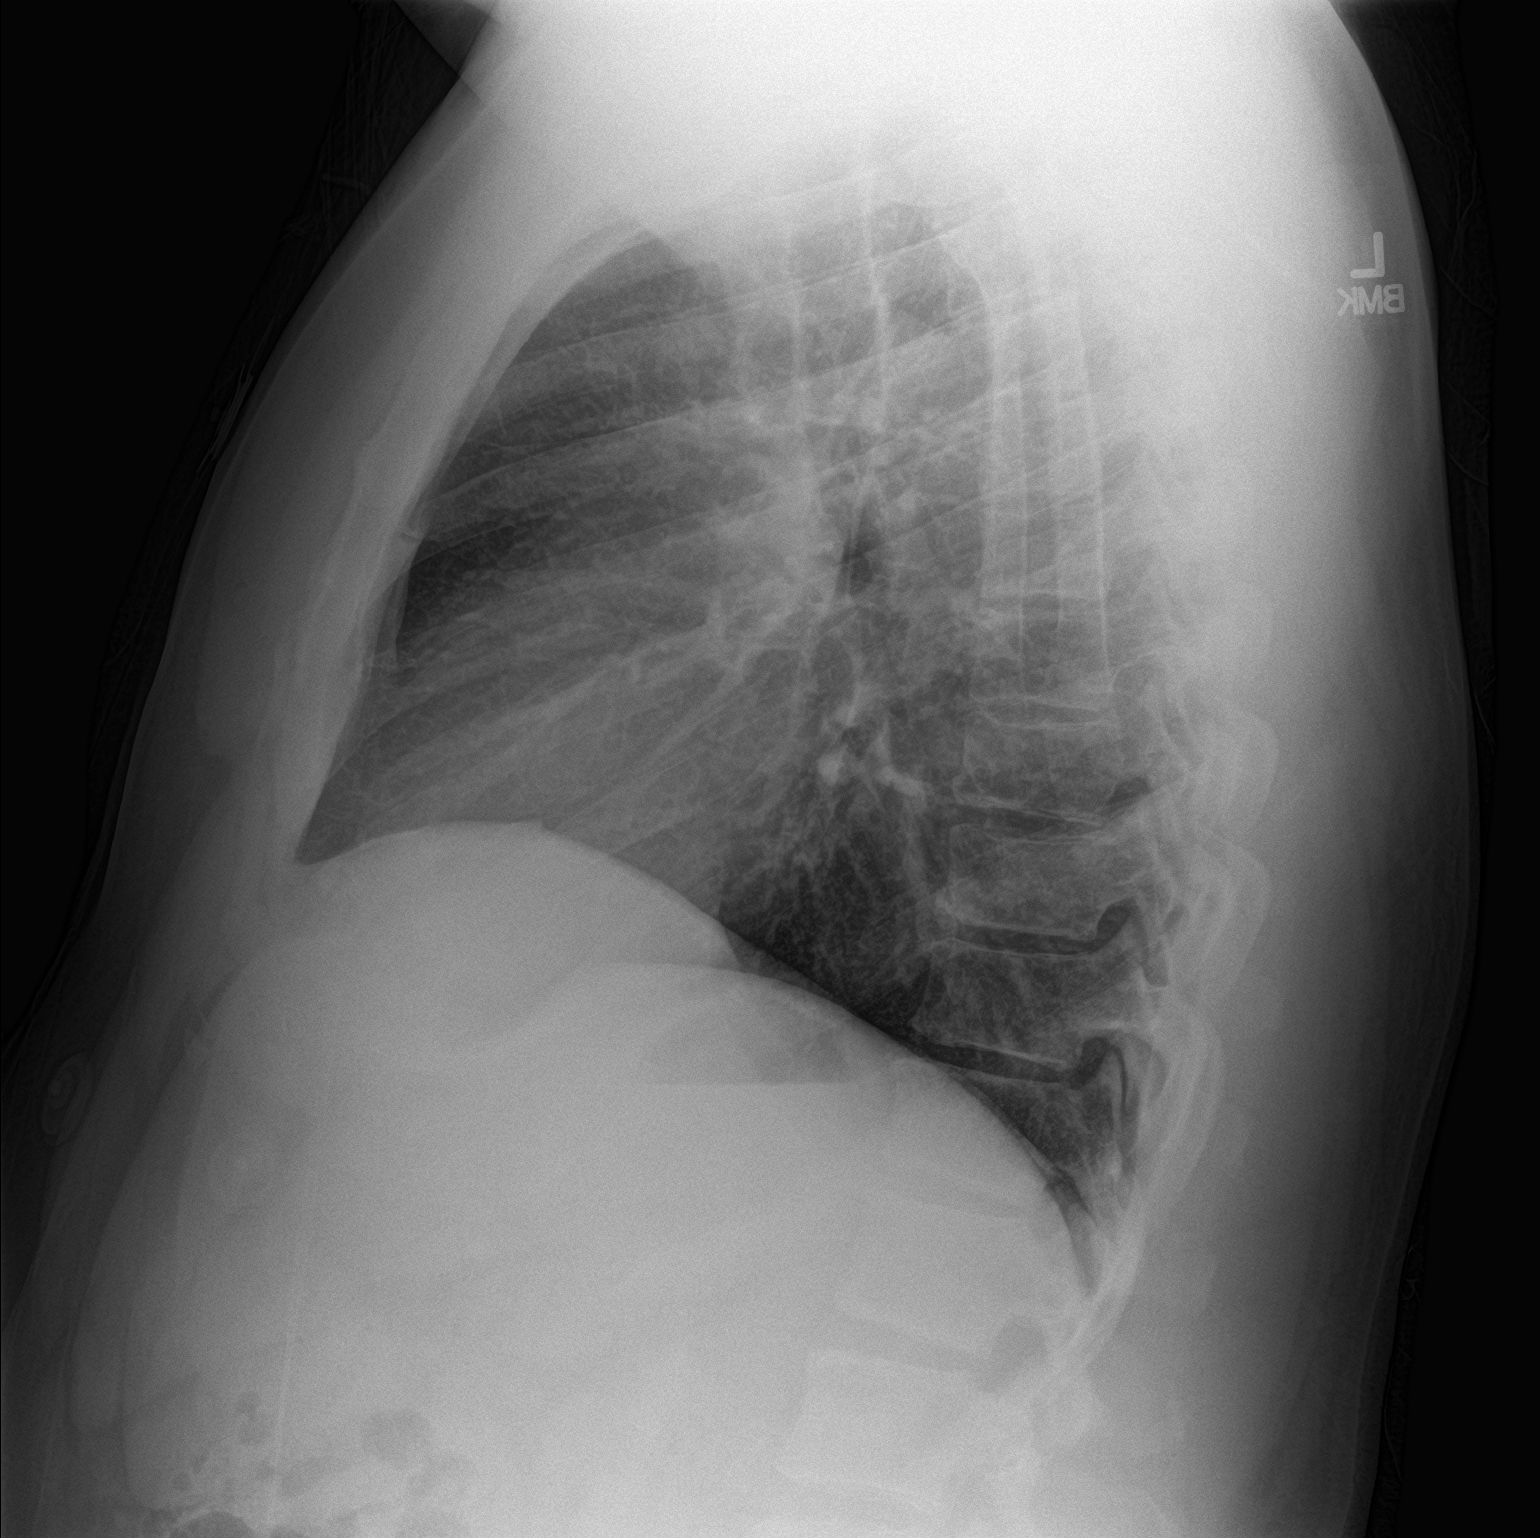

[2 of 2 positions shown; findings below may reference images not displayed]

FINDINGS: Normal heart size and mediastinal contours. No acute infiltrate or
edema. No effusion or pneumothorax. No acute osseous findings.
IMPRESSION: No active cardiopulmonary disease.
